# Patient Record
Sex: Male | Born: 1980 | Race: White | Hispanic: No | Marital: Single | Smoking: Never smoker
Health system: Southern US, Community
[De-identification: ages and names within clinical notes are randomized; demographics above are authoritative.]

---

## 2014-12-01 ENCOUNTER — Encounter (HOSPITAL_COMMUNITY): Payer: Self-pay | Admitting: Emergency Medicine

## 2014-12-01 ENCOUNTER — Emergency Department (HOSPITAL_COMMUNITY)
Admission: EM | Admit: 2014-12-01 | Discharge: 2014-12-01 | Disposition: A | Discharge status: Home / Self Care | Payer: Self-pay | Attending: Emergency Medicine | Admitting: Emergency Medicine

## 2014-12-01 ENCOUNTER — Emergency Department (HOSPITAL_COMMUNITY): Payer: Self-pay

## 2014-12-01 DIAGNOSIS — J069 Acute upper respiratory infection, unspecified: Secondary | ICD-10-CM | POA: Insufficient documentation

## 2014-12-01 MED ORDER — BENZONATATE 100 MG PO CAPS: 100.0000 mg | ORAL_CAPSULE | Freq: Three times a day (TID) | ORAL | Status: AC

## 2014-12-01 MED ORDER — ALBUTEROL SULFATE HFA 108 (90 BASE) MCG/ACT IN AERS
2.0000 | INHALATION_SPRAY | Freq: Once | RESPIRATORY_TRACT | Status: AC
  Administered 2014-12-01: 2 via RESPIRATORY_TRACT
  Filled 2014-12-01: qty 6.7

## 2014-12-01 MED ORDER — BENZONATATE 100 MG PO CAPS
100.0000 mg | ORAL_CAPSULE | Freq: Once | ORAL | Status: AC
  Administered 2014-12-01: 100 mg via ORAL
  Filled 2014-12-01: qty 1

## 2014-12-01 NOTE — ED Notes (Signed)
Per pt, states cough for a couple of weeks-recent move and job

## 2014-12-01 NOTE — ED Provider Notes (Signed)
CSN: [id number]     Arrival date & time 12/01/14  1157 History  By signing my name below, I, Sonum Patel, attest that this documentation has been prepared under the direction and in the presence of [employer]. Electronically Signed: Sonum Patel, Neurosurgeon. 12/01/2014. 1:03 PM.    Chief Complaint  Patient presents with  . Cough   The history is provided by the patient. No language interpreter was used.     HPI Comments: Daniel Sampson is a 34 y.o. male who presents to the Emergency Department complaining of cough occasionally productive of light green sputum x several weeks. He states his symptoms were precipitated by a cold. He denies exacerbating factors. He has tried OTC medications without significant symptom relief. He reports nasal congestion and intermittent wheezing. He denies history of asthma. He denies fever, chills, sore throat, eye pain, ear pain, HA, lightheadedness, dizziness, CP, SOB, hemoptysis, abdominal pain, nausea, vomiting.   History reviewed. No pertinent past medical history. History reviewed. No pertinent past surgical history. No family history on file. Social History  Substance Use Topics  . Smoking status: Never Smoker   . Smokeless tobacco: None  . Alcohol Use: No      Review of Systems  Constitutional: Negative for fever and chills.  HENT: Positive for congestion. Negative for sore throat.   Eyes: Negative for visual disturbance.  Respiratory: Positive for cough and wheezing. Negative for shortness of breath.   Cardiovascular: Negative for chest pain.  Gastrointestinal: Negative for nausea, vomiting and abdominal pain.  Neurological: Negative for dizziness, light-headedness and headaches.  All other systems reviewed and are negative.     Allergies  Review of patient's allergies indicates not on file.  Home Medications   Prior to Admission medications   Not on File    BP 144/90 mmHg  Pulse 92  Temp(Src) 97.7 F (36.5 C) (Oral)   Resp 18  SpO2 95% Physical Exam  Constitutional: He is oriented to person, place, and time. He appears well-developed and well-nourished. No distress.  HENT:  Head: Normocephalic and atraumatic.  Right Ear: External ear normal.  Left Ear: External ear normal.  Nose: Nose normal.  Mouth/Throat: Uvula is midline, oropharynx is clear and moist and mucous membranes are normal. No oropharyngeal exudate, posterior oropharyngeal edema, posterior oropharyngeal erythema or tonsillar abscesses.  Eyes: Conjunctivae, EOM and lids are normal. Pupils are equal, round, and reactive to light. Right eye exhibits no discharge. Left eye exhibits no discharge. No scleral icterus.  Neck: Normal range of motion. Neck supple.  Cardiovascular: Normal rate, regular rhythm, normal heart sounds, intact distal pulses and normal pulses.   Pulmonary/Chest: Effort normal. No respiratory distress. He has wheezes. He has no rales.  Mild end expiratory wheezing to lower lung fields bilaterally.  Abdominal: Soft. Normal appearance and bowel sounds are normal. He exhibits no distension and no mass. There is no tenderness. There is no rigidity, no rebound and no guarding.  Musculoskeletal: Normal range of motion. He exhibits no edema or tenderness.  Neurological: He is alert and oriented to person, place, and time. He has normal strength. No sensory deficit.  Skin: Skin is warm, dry and intact. No rash noted. He is not diaphoretic. No erythema. No pallor.  Psychiatric: He has a normal mood and affect. His speech is normal and behavior is normal.  Nursing note and vitals reviewed.   ED Course  Procedures (including critical care time)  DIAGNOSTIC STUDIES: Oxygen Saturation is 95% on RA, adequate by  my interpretation.    COORDINATION OF CARE: 1:10 PM Discussed treatment plan with pt at bedside and pt agreed to plan.   Labs Review Labs Reviewed - No data to display  Imaging Review Dg Chest 2 View  12/01/2014    CLINICAL DATA:  Chronic cough  EXAM: CHEST  2 VIEW  COMPARISON:  None.  FINDINGS: The heart size and mediastinal contours are within normal limits. Both lungs are clear. The visualized skeletal structures are unremarkable.  IMPRESSION: No active cardiopulmonary disease.   Electronically Signed   By: Natasha Mead M.D.   On: 12/01/2014 12:37   I have personally reviewed and evaluated these images as part of my medical decision-making.   EKG Interpretation None      MDM   Final diagnoses:  URI (upper respiratory infection)    34 year old male presents with cough, which is occasionally productive of light green sputum, and has been present for the past several weeks. He states his symptoms were precipitated by a cold. He reports nasal congestion and intermittent wheezing. He denies fever, chills, headache, lightheadedness, dizziness, chest pain, shortness of breath, abdominal pain, nausea, vomiting.  Patient is afebrile. Vital signs stable. O2 sat 95% on room air. Posterior oropharynx clear and without edema, erythema, or exudate. Heart regular rate and rhythm. Lungs with mild end expiratory wheezing to lower fields bilaterally. No respiratory distress. Abdomen soft, nontender, nondistended. No lower extremity edema.  Chest x-ray negative for active cardiopulmonary disease. Symptoms most likely due to viral upper respiratory infection. Will treat with tessalon for cough and albuterol inhaler for wheezing. Patient to follow up with primary care physician within the next week, resource list given. Return precautions discussed.  I personally performed the services described in this documentation, which was scribed in my presence. The recorded information has been reviewed and is accurate.   BP 144/90 mmHg  Pulse 92  Temp(Src) 97.7 F (36.5 C) (Oral)  Resp 18  SpO2 95%   Mady Gemma, PA-C 12/01/14 1334  Benjiman Core, MD 12/01/14 1538

## 2014-12-01 NOTE — Discharge Instructions (Signed)
1. Medications: tessalon (cough medicine), inhaler, usual home medications 2. Treatment: rest, drink plenty of fluids 3. Follow Up: please followup with your primary doctor within the next week for discussion of your diagnoses and further evaluation after today's visit; if you do not have a primary care doctor use the resource guide provided to find one; please return to the ER for high fever, shortness of breath, chest pain, new or worsening symptoms   Cough, Adult A cough helps to clear your throat and lungs. A cough may last only 2-3 weeks (acute), or it may last longer than 8 weeks (chronic). Many different things can cause a cough. A cough may be a sign of an illness or another medical condition. HOME CARE  Pay attention to any changes in your cough.  Take medicines only as told by your doctor.  If you were prescribed an antibiotic medicine, take it as told by your doctor. Do not stop taking it even if you start to feel better.  Talk with your doctor before you try using a cough medicine.  Drink enough fluid to keep your pee (urine) clear or pale yellow.  If the air is dry, use a cold steam vaporizer or humidifier in your home.  Stay away from things that make you cough at work or at home.  If your cough is worse at night, try using extra pillows to raise your head up higher while you sleep.  Do not smoke, and try not to be around smoke. If you need help quitting, ask your doctor.  Do not have caffeine.  Do not drink alcohol.  Rest as needed. GET HELP IF:  You have new problems (symptoms).  You cough up yellow fluid (pus).  Your cough does not get better after 2-3 weeks, or your cough gets worse.  Medicine does not help your cough and you are not sleeping well.  You have pain that gets worse or pain that is not helped with medicine.  You have a fever.  You are losing weight and you do not know why.  You have night sweats. GET HELP RIGHT AWAY IF:  You cough up  blood.  You have trouble breathing.  Your heartbeat is very fast.   This information is not intended to replace advice given to you by your health care provider. Make sure you discuss any questions you have with your health care provider.   Document Released: 10/21/2010 Document Revised: 10/29/2014 Document Reviewed: 04/16/2014 Elsevier Interactive Patient Education 2016 Elsevier Inc.  Upper Respiratory Infection, Adult Most upper respiratory infections (URIs) are caused by a virus. A URI affects the nose, throat, and upper air passages. The most common type of URI is often called "the common cold." HOME CARE   Take medicines only as told by your doctor.  Gargle warm saltwater or take cough drops to comfort your throat as told by your doctor.  Use a warm mist humidifier or inhale steam from a shower to increase air moisture. This may make it easier to breathe.  Drink enough fluid to keep your pee (urine) clear or pale yellow.  Eat soups and other clear broths.  Have a healthy diet.  Rest as needed.  Go back to work when your fever is gone or your doctor says it is okay.  You may need to stay home longer to avoid giving your URI to others.  You can also wear a face mask and wash your hands often to prevent spread of the virus.  Use your inhaler more if you have asthma.  Do not use any tobacco products, including cigarettes, chewing tobacco, or electronic cigarettes. If you need help quitting, ask your doctor. GET HELP IF:  You are getting worse, not better.  Your symptoms are not helped by medicine.  You have chills.  You are getting more short of breath.  You have brown or red mucus.  You have yellow or brown discharge from your nose.  You have pain in your face, especially when you bend forward.  You have a fever.  You have puffy (swollen) neck glands.  You have pain while swallowing.  You have white areas in the back of your throat. GET HELP RIGHT AWAY  IF:   You have very bad or constant:  Headache.  Ear pain.  Pain in your forehead, behind your eyes, and over your cheekbones (sinus pain).  Chest pain.  You have long-lasting (chronic) lung disease and any of the following:  Wheezing.  Long-lasting cough.  Coughing up blood.  A change in your usual mucus.  You have a stiff neck.  You have changes in your:  Vision.  Hearing.  Thinking.  Mood. MAKE SURE YOU:   Understand these instructions.  Will watch your condition.  Will get help right away if you are not doing well or get worse.   This information is not intended to replace advice given to you by your health care provider. Make sure you discuss any questions you have with your health care provider.   Document Released: 07/27/2007 Document Revised: 06/24/2014 Document Reviewed: 05/15/2013 Elsevier Interactive Patient Education 2016 [employer].   Emergency Department Resource Guide 1) Find a Doctor and Pay Out of Pocket Although you won't have to find out who is covered by your insurance plan, it is a good idea to ask around and get recommendations. You will then need to call the office and see if the doctor you have chosen will accept you as a new patient and what types of options they offer for patients who are self-pay. Some doctors offer discounts or will set up payment plans for their patients who do not have insurance, but you will need to ask so you aren't surprised when you get to your appointment.  2) Contact Your Local Health Department Not all health departments have doctors that can see patients for sick visits, but many do, so it is worth a call to see if yours does. If you don't know where your local health department is, you can check in your phone book. The CDC also has a tool to help you locate your state's health department, and many state websites also have listings of all of their local health departments.  3) Find a Walk-in Clinic If  your illness is not likely to be very severe or complicated, you may want to try a walk in clinic. These are popping up all over the country in pharmacies, drugstores, and shopping centers. They're usually staffed by nurse practitioners or physician assistants that have been trained to treat common illnesses and complaints. They're usually fairly quick and inexpensive. However, if you have serious medical issues or chronic medical problems, these are probably not your best option.  No Primary Care Doctor: - Call Health Connect at  [phone] - they can help you locate a primary care doctor that  accepts your insurance, provides certain services, etc. - Physician Referral Service- [phone]  Chronic Pain Problems: Organization  Address  Phone   Notes  [facility]  [phone] Patients need to be referred by their primary care doctor.   Medication Assistance: Organization         Address  Phone   Notes  [facility] Medication [facility] [address] [place] [place], [place] [address] [phone] --Must be a resident of [facility] -- Must have NO insurance coverage whatsoever (no Medicaid/ Medicare, etc.) -- The pt. MUST have a primary care doctor that directs their care regularly and follows them in the community   [facility]  [phone]   [employer]  [phone]    Agencies that provide inexpensive medical care: Organization         Address  Phone   Notes  [facility]  [phone]   [facility]    [phone]   [facility] [address] [place], [place] [address] [phone]   [facility] [address] [place] [place] [phone]   Planned Parenthood    [phone]   [facility]    [phone]   Community Health and [facility]  [address], [employer] Phone:  [phone], Fax:  [phone] Hours of  Operation:  9 am - 6 pm, M-F.  Also accepts Medicaid/Medicare and self-pay.  [facility]  [address], [place] Phone: [phone], Fax: [phone]. Hours of Operation:  8:30 am - 5:30 pm, M-F.  Also accepts Medicaid and self-pay.  [facility] [place] [address], [place] Phone: [phone]   [facility] [address] Natasha Bence [place], [place] [phone], Ext. [phone] Mondays & Thursdays: 7-9 AM.  First 15 patients are seen on a first come, first serve basis.    Medicaid-accepting [facility] Providers:  Organization         Address  Phone   Notes  [facility] [address], [place] [phone] Also accepts self-pay patients.  [facility] [address] Laurell Josephs [place], [place]  [phone]   [facility] [address], [place] [phone]   [facility] [address] [place] [phone]   Renaye Rakers [address], [place]   [phone] Only accepts [place] Access [place] patients after they have their name applied to their card.   Self-Pay (no insurance) in [facility]:  Organization         Address  Phone   Notes  Sickle Cell Patients, [facility] [address] [place], [place] [phone]   [facility] [address] [place], [place] [phone]   [facility] [place]  [address], [place] [phone]   [facility]/Dr. Osei-Bonsu  [address], [place] or [address], [place] [phone] Phone number for both [place] and [place] locations is the same.  [facility]gent Medical and [facility] [address], [place] [phone]   [facility] [address], [place] or [address] [phone]   [facility] [address], [place] [phone], phone; [phone], fax Sees patients 1st and 3rd Saturday of every month.  Must not qualify  for public or private insurance (i.e. Medicaid, Medicare, [place] Health Choice, Veterans' Benefits)  Household income should be no more than 200% of the poverty level The clinic cannot treat you if you are pregnant or think you are pregnant  Sexually transmitted diseases are not treated at the clinic.    Dental Care: Organization         Address  Phone  Notes  [facility] [address] [place], [place] [phone] Accepts children up to age 31 who are enrolled in [place] or [place] Health Choice; pregnant women with a Medicaid card; and children who have applied for Medicaid or [place] Health Choice, but were declined, whose parents can pay a reduced fee at time of service.  [facility] Department of [facility]  [address], [place] [phone] Accepts children up to age 69 who are enrolled in [place] or [place] Health Choice; pregnant women with a Medicaid card; and children who have applied for Medicaid or  Health Choice, but were declined, whose parents can pay a reduced fee at time of service.  [facility]  [address] [place], [place] [phone] Patients are seen by appointment only. Walk-ins are not accepted. [facility] will see patients 19 years of age and older. Monday - Tuesday (8am-5pm) Most Wednesdays (8:30-5pm) $30 per visit, cash only  [facility] Adult Dental Access [facility]  [address], [facility] [phone] Patients are seen by appointment only. Walk-ins are not accepted. [facility] will see patients 14 years of age and older. One Wednesday Evening (Monthly: Volunteer Based).  $30 per visit, cash only  [employer]  [phone] for adults; Children under age 66, call Graduate Pediatric  Dentistry at [phone]. Children aged 70-14, please call [phone] to request a pediatric application.  Dental services are provided in all areas of dental care including fillings, crowns and bridges, complete and partial dentures, implants, gum treatment, root canals, and extractions. Preventive care is also provided. Treatment is provided to both adults and children. Patients are selected via a lottery and there is often a waiting list.   [facility] [address] [place]  [phone] [email or web address]   [facility] [address] [place], [place] [phone], Ext. [phone] Second and Fourth Thursday of each month, opens at 6:30 AM; Clinic ends at 9 AM.  Patients are seen on a first-come first-served basis, and a limited number are seen during each clinic.   [facility]  [address] Ether Griffins [place], [place] [phone]   Eligibility Requirements You must have lived in [place], [place], or [place] counties for at least the last three months.   You cannot be eligible for state or federal sponsored [place], including [employer], [place], or [employer].   You generally cannot be eligible for healthcare insurance through your employer.    How to apply: Eligibility screenings are held every Tuesday and Wednesday afternoon from 1:00 pm until 4:00 pm. You do not need an appointment for the interview!  [facility] [address] [place], [place] [phone]   [facility] Health Department  [phone]   [facility] Health Department  [phone]   [facility] Health Department  [phone]    Behavioral Health Resources in the Community: Intensive Outpatient Programs Organization         Address  Phone  Notes  [facility] [address] [place], [place] [phone]   [facility] [address] [place], [place] [phone]   ADS:  [facility] [address] [place], [place]  [phone]   [facility] Mental Health [address]  [place], [place] [phone] or [phone]   Substance Abuse Resources Organization         Address  Phone  Notes  [facility]  [phone]   [facility]  [phone]   [facility]  [phone]   Floydene Flock  [phone]   Residential & Outpatient Substance Abuse Program  [phone]   Psychological Services Organization         Address  Phone  Notes  [facility]  [phone]   [facility]  [phone]   [facility] Mental Health [address], [place] [phone] or [phone]    Mobile Crisis Teams Organization         Address  Phone  Notes  [facility], Mobile Crisis Care Unit  [phone]   [facility]  [address] [place], [place] [phone]   Doristine Locks [address] [place] [phone]    Self-Help/Support Groups Organization         Address  Phone             Notes  [facility] [place] - variety of support groups  336- I7437963 Call for more information  Narcotics Anonymous (NA), [facility] [address], [employer] [place]  2 meetings at this location   Statistician         Address  Phone  Notes  [facility] [address],    [place]  [phone]   [facility]  [address] [place] [address], [place], [place] [phone]   [facility] [address] [place], [place] [phone] Admissions: 8am-3pm M-F  [facility] [address]    [place], [place] [phone]   [facility] [address] [place], [place], [place] [phone]   [facility] [address]  [place], [place] [phone]   [facility] - Intensive Outpatient [address] Laurell Josephs [address],  [place], [place] [phone]   [facility] ([facility] [address] [place].,  [place], [place] [phone] or [phone]   [facility] ([facility]) [address] [place], [place] [phone] Accepts Medicaid  [facility] [address],  [place] [phone] Substance Abuse/Addiction Treatment   [facility] Organization         Address  Phone  Notes  [facility]  [phone]   Angie Fava, PhD [address] Ervin Knack [place], [place]   [phone] or [phone]   [facility]   [address] [place], [place] [phone]   [facility] [address] [place], [place] [phone] Insurance/Medicaid/sponsorship through [facility] and Families [address]                                    [place], [place] [phone] Therapy/tele-psych/case  [facility] [address], [place] [phone]    Dr. Lolly Mustache  [phone]   [facility]  [address]  [place] [facility] 1) [address], [place] °2) [address], [place] °3)  [address], [place] [phone] °([phone] ° °(336) [phone]   °[place] Child Abuse Hotline (336) 342-1394 or (336) [phone] (After Hours)    ° ° ° °

## 2016-09-25 IMAGING — CR DG CHEST 2V
2 series · 2 of 2 positions shown · non-contrast
Comparison: None.

CLINICAL DATA: Chronic cough

EXAM:
CHEST  2 VIEW

[w chest pa]
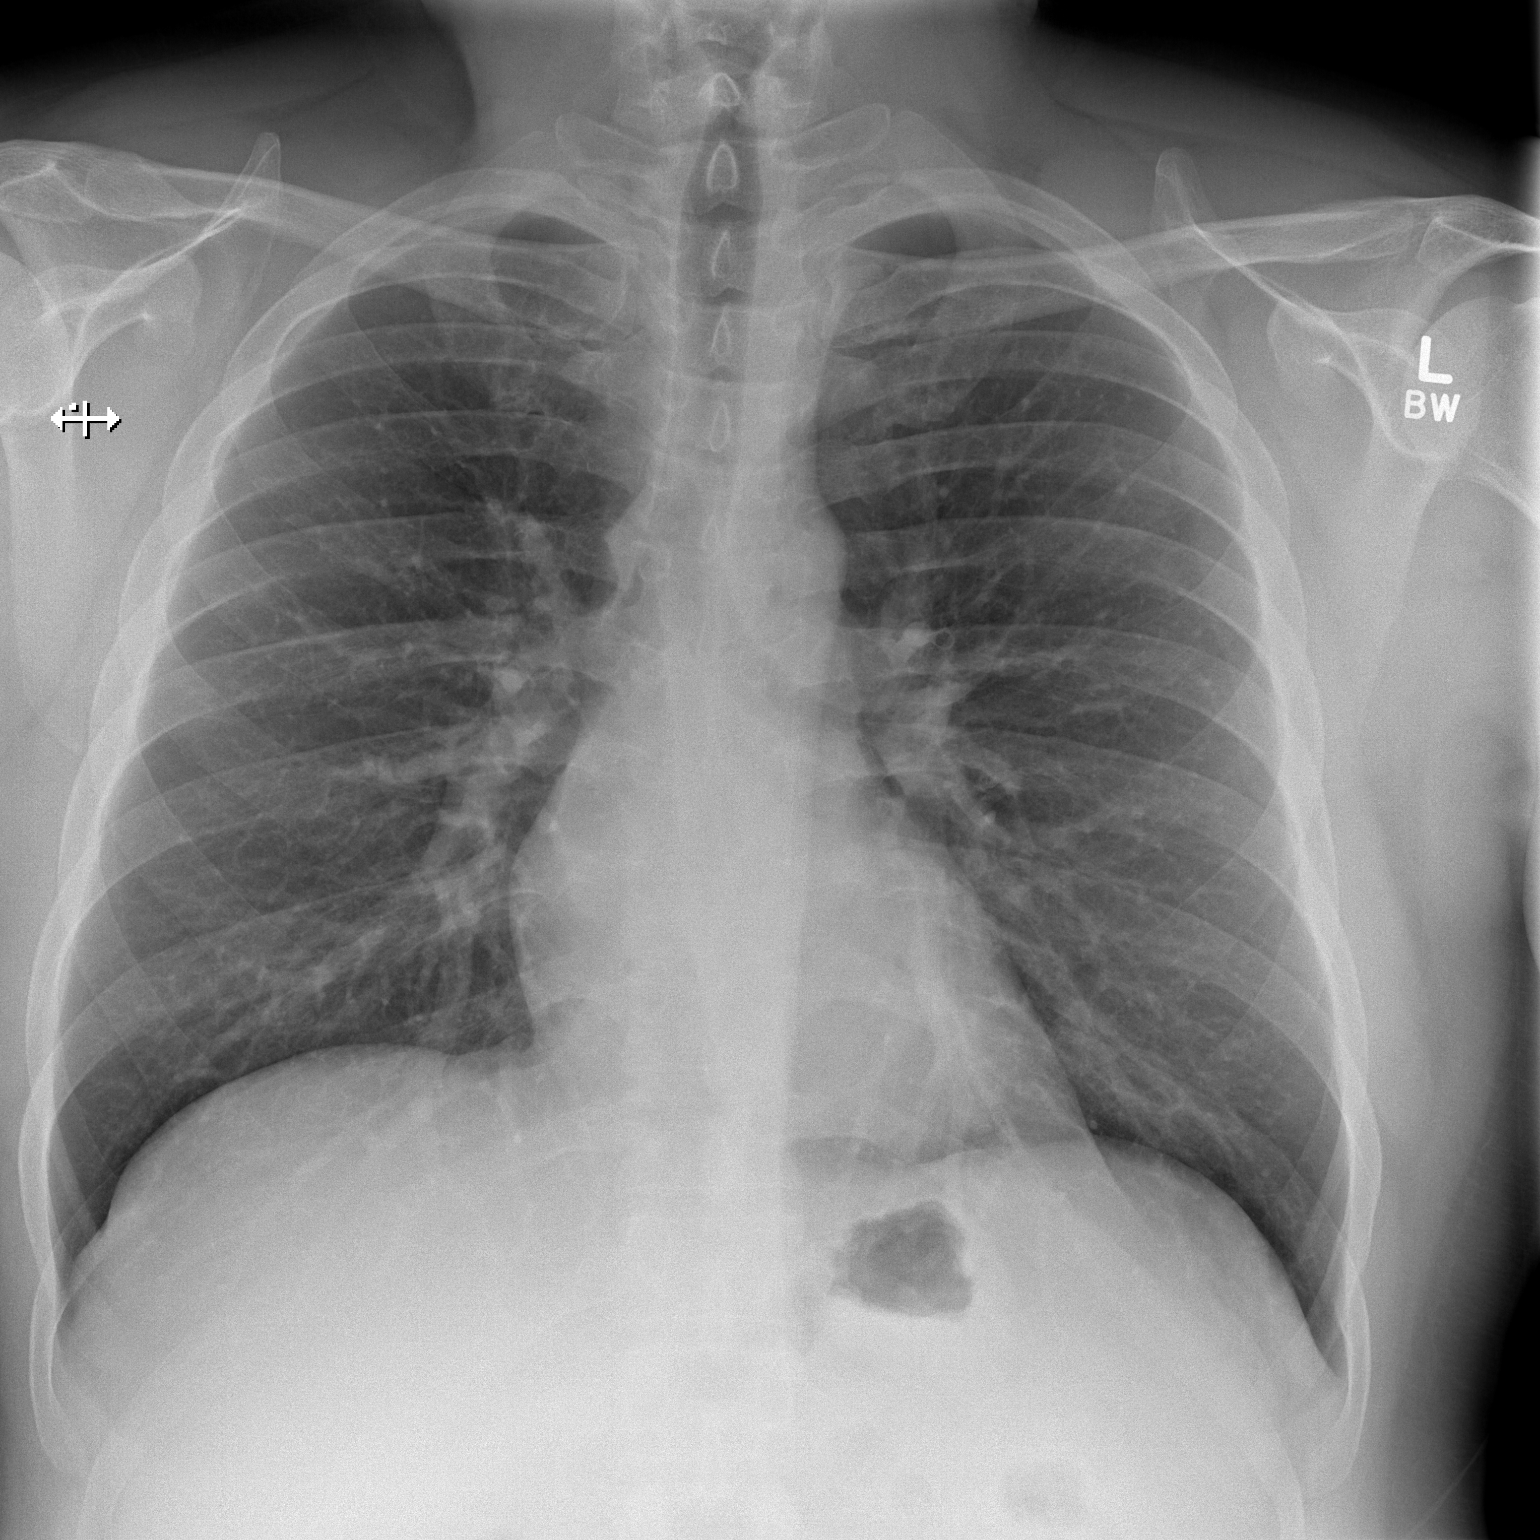

[w chest lat]
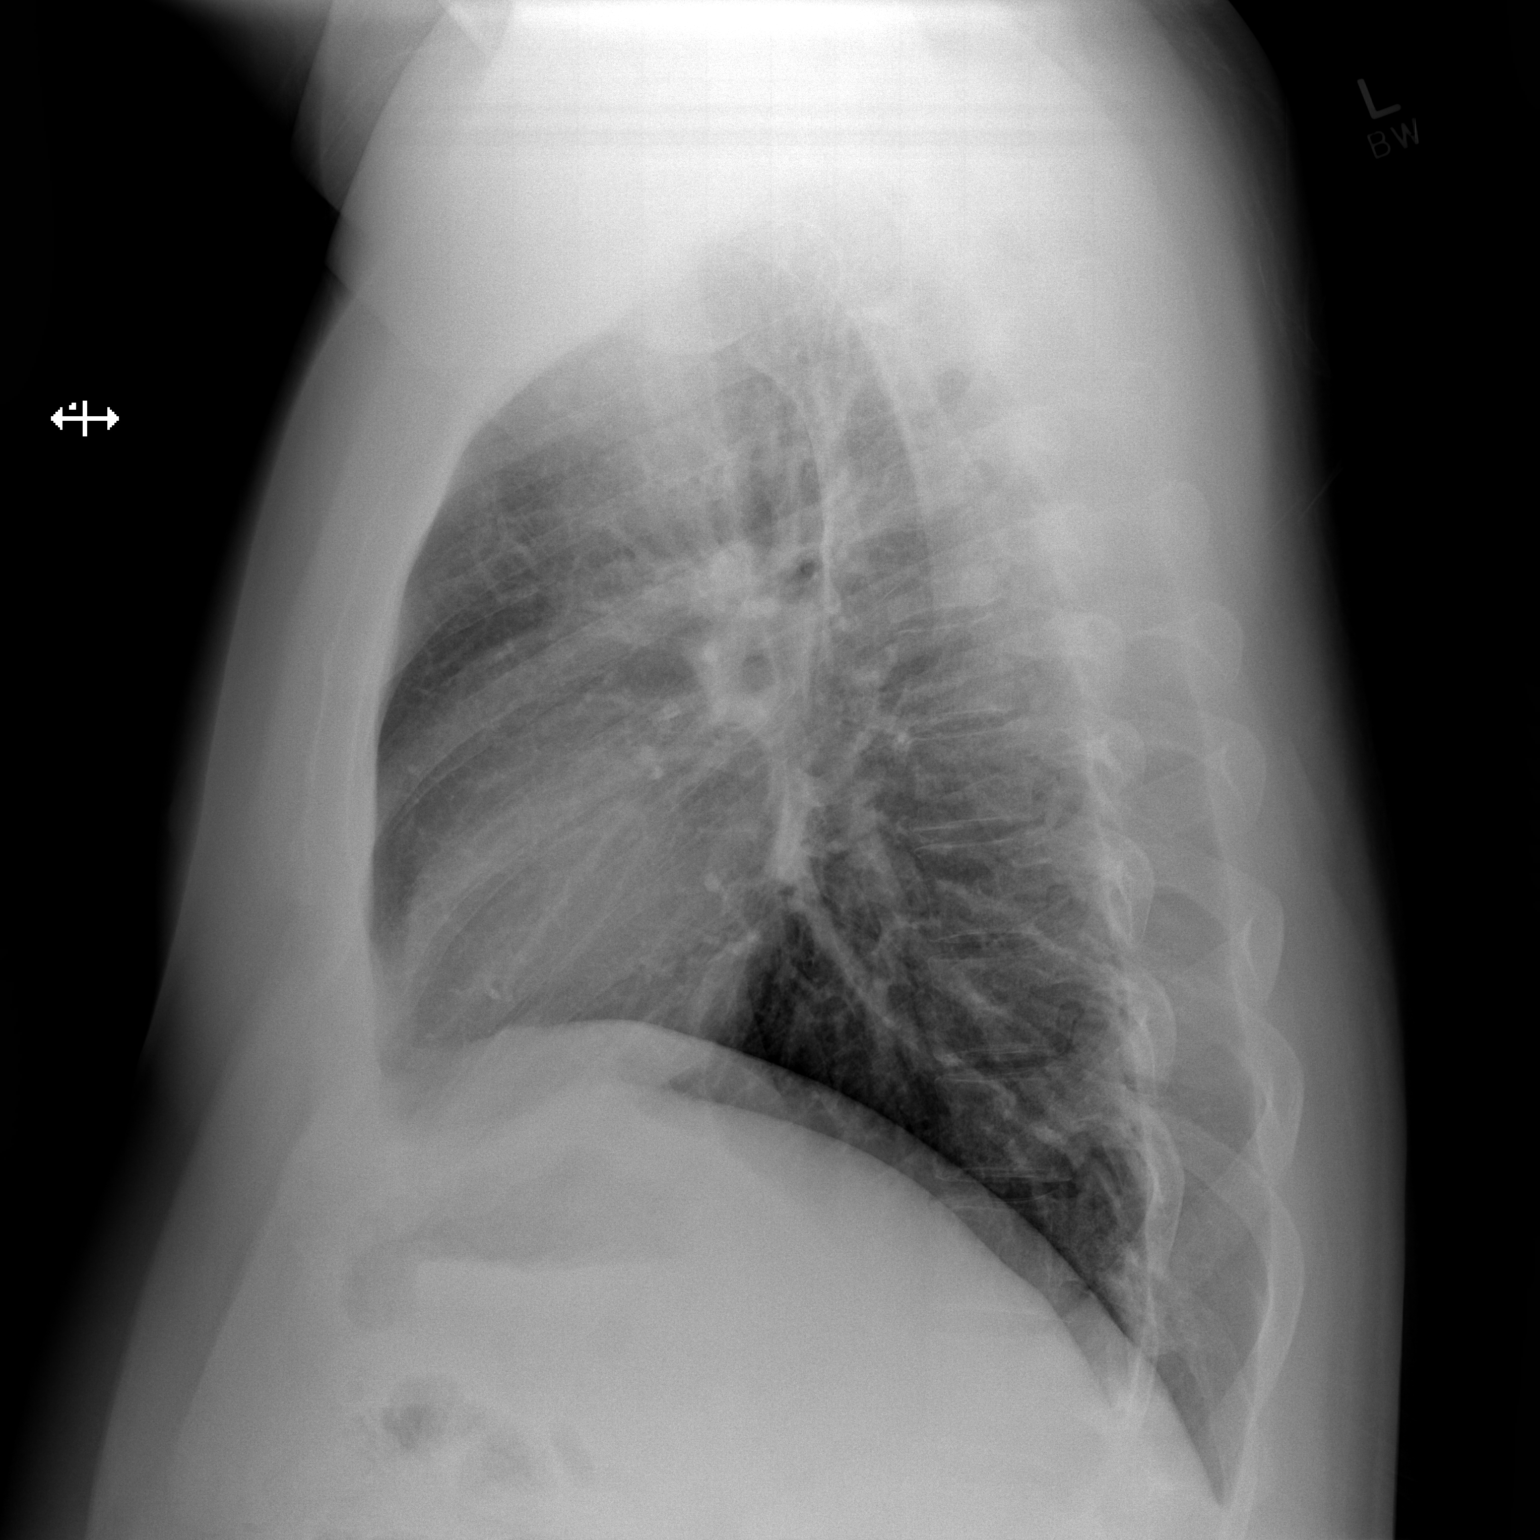

[2 of 2 positions shown; findings below may reference images not displayed]

FINDINGS: The heart size and mediastinal contours are within normal limits.
Both lungs are clear. The visualized skeletal structures are
unremarkable.
IMPRESSION: No active cardiopulmonary disease.

## 2019-10-01 ENCOUNTER — Other Ambulatory Visit: Payer: Self-pay | Admitting: Family Medicine

## 2019-10-01 ENCOUNTER — Ambulatory Visit
Admission: RE | Admit: 2019-10-01 | Discharge: 2019-10-01 | Disposition: A | Discharge status: Home / Self Care | Payer: Managed Care, Other (non HMO) | Source: Ambulatory Visit | Attending: Family Medicine | Admitting: Family Medicine

## 2019-10-01 DIAGNOSIS — S93492A Sprain of other ligament of left ankle, initial encounter: Secondary | ICD-10-CM

## 2021-07-26 IMAGING — CR DG ANKLE COMPLETE 3+V*L*
3 series · 3 of 3 positions shown · non-contrast
Comparison: None.

CLINICAL DATA: Left ankle pain since a twisting injury 5 days ago.
Initial encounter.

EXAM:
LEFT ANKLE COMPLETE - 3+ VIEW

[x ankle ap left]
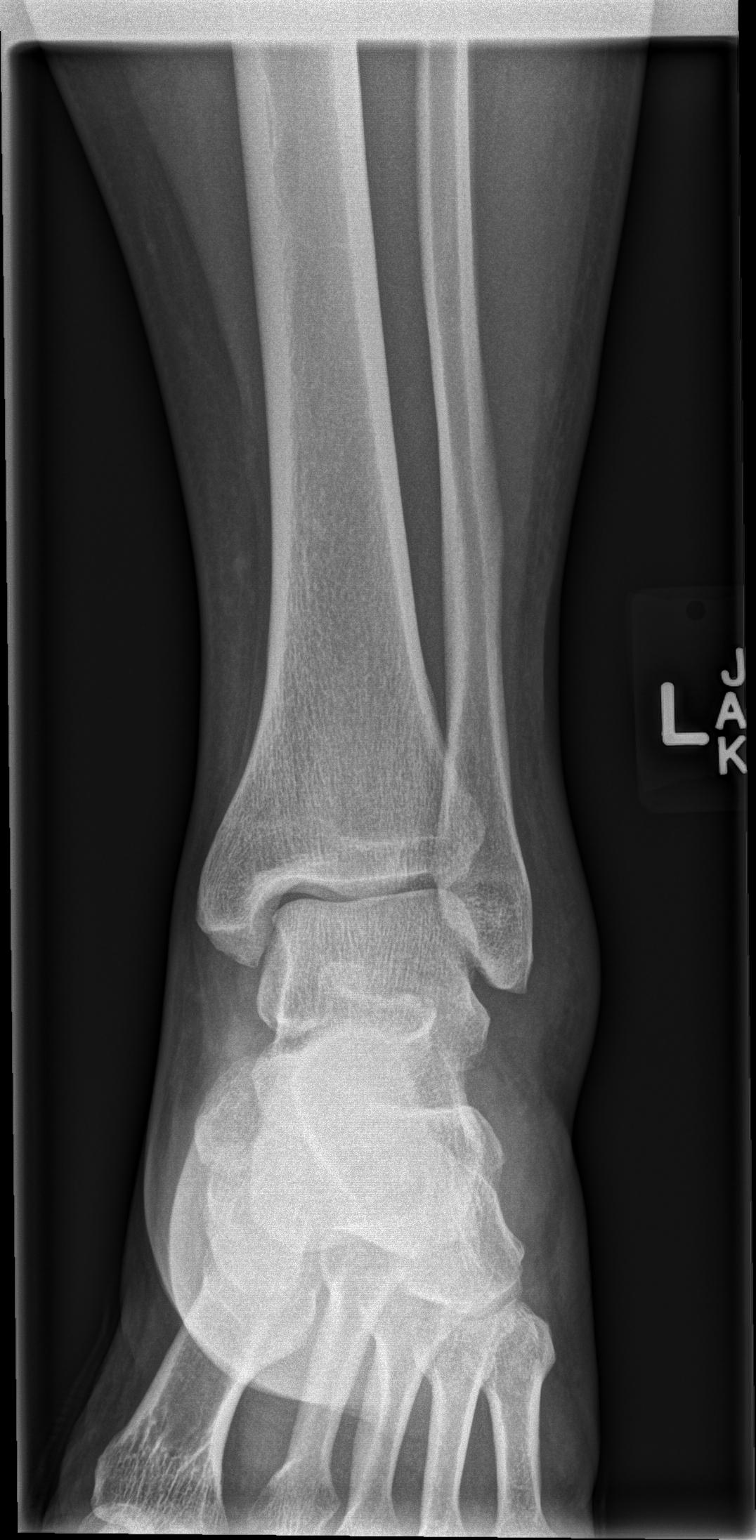

[x ankle obl left]
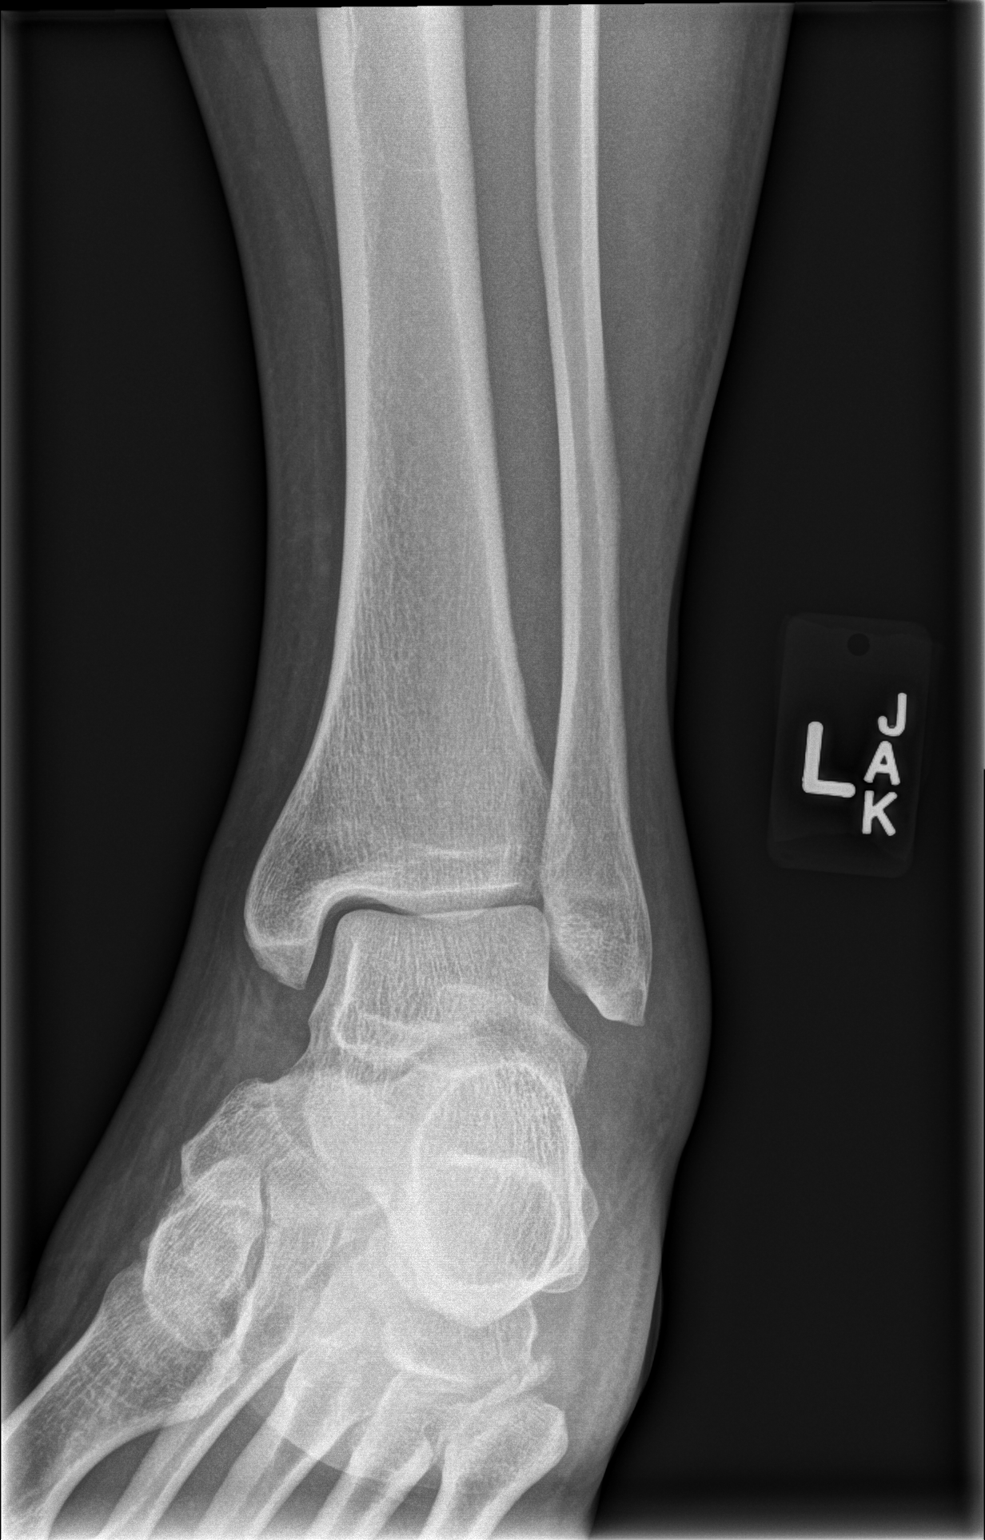

[x ankle lat left]
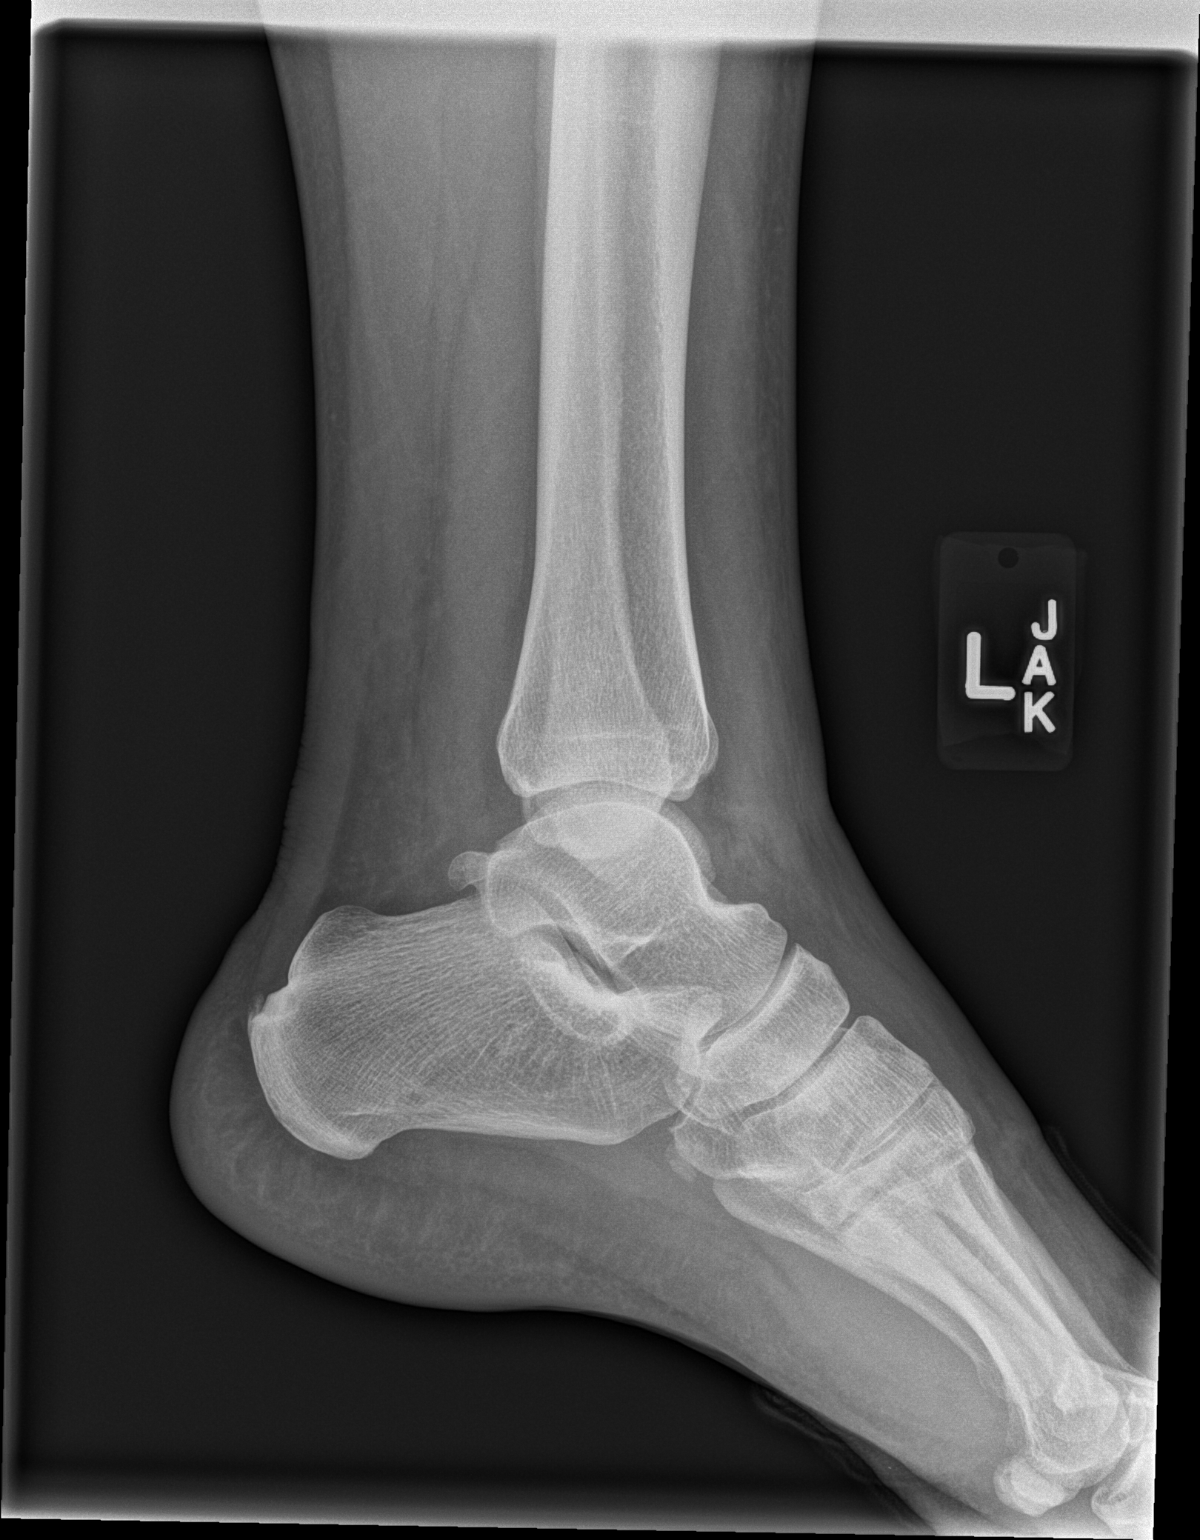

[3 of 3 positions shown; findings below may reference images not displayed]

FINDINGS: There is no evidence of fracture, dislocation, or joint effusion.
There is no evidence of arthropathy or other focal bone abnormality.
Lateral soft tissues appear swollen.
IMPRESSION: Lateral soft tissues appear swollen. The exam is otherwise
unremarkable.
